# Patient Record
Sex: Male | Born: 1980 | Race: Black or African American | Hispanic: No | Marital: Married | Smoking: Former smoker
Health system: Southern US, Community
[De-identification: ages and names within clinical notes are randomized; demographics above are authoritative.]

## PROBLEM LIST (undated history)

## (undated) DIAGNOSIS — G4730 Sleep apnea, unspecified: Secondary | ICD-10-CM

## (undated) DIAGNOSIS — M48062 Spinal stenosis, lumbar region with neurogenic claudication: Secondary | ICD-10-CM

## (undated) DIAGNOSIS — R519 Headache, unspecified: Secondary | ICD-10-CM

## (undated) DIAGNOSIS — I10 Essential (primary) hypertension: Secondary | ICD-10-CM

## (undated) DIAGNOSIS — E039 Hypothyroidism, unspecified: Secondary | ICD-10-CM

## (undated) DIAGNOSIS — R51 Headache: Secondary | ICD-10-CM

## (undated) DIAGNOSIS — F419 Anxiety disorder, unspecified: Secondary | ICD-10-CM

## (undated) DIAGNOSIS — K219 Gastro-esophageal reflux disease without esophagitis: Secondary | ICD-10-CM

## (undated) HISTORY — PX: NO PAST SURGERIES: SHX2092

---

## 2017-06-26 ENCOUNTER — Other Ambulatory Visit: Payer: Self-pay | Admitting: Family Medicine

## 2017-06-26 DIAGNOSIS — M5441 Lumbago with sciatica, right side: Secondary | ICD-10-CM

## 2017-06-26 DIAGNOSIS — M5442 Lumbago with sciatica, left side: Principal | ICD-10-CM

## 2017-06-29 ENCOUNTER — Ambulatory Visit
Admission: RE | Admit: 2017-06-29 | Discharge: 2017-06-29 | Disposition: A | Discharge status: Home / Self Care | Payer: Managed Care, Other (non HMO) | Source: Ambulatory Visit | Attending: Family Medicine | Admitting: Family Medicine

## 2017-06-29 DIAGNOSIS — M5442 Lumbago with sciatica, left side: Principal | ICD-10-CM

## 2017-06-29 DIAGNOSIS — M5441 Lumbago with sciatica, right side: Secondary | ICD-10-CM

## 2017-07-15 ENCOUNTER — Other Ambulatory Visit: Payer: Self-pay | Admitting: Neurosurgery

## 2017-07-23 NOTE — Pre-Procedure Instructions (Signed)
Jesse Reilly  07/23/2017    Your procedure is scheduled on Monday, July 29, 2017 at 7:30 AM.   [facility] En[facility] "A" Admitting Office at 5:30 AM.   Call this number if you have problems the morning of surgery: [phone]   Questions prior to day of surgery, please call [phone] between 8 & 4 PM.   Remember:  Do not eat food or drink liquids after midnight Sunday, 07/28/17.  Take these medicines the morning of surgery with A SIP OF WATER: Levothyroxine (Synthroid), Clonazepam (Klonopin) - if needed, Hydrocodone - if needed, Omeprazole (Prilosec) - if needed  Stop NSAIDS (Diclofenac, Voltaren, Ibuprofen, etc) as of today. Do not use Aspirin products prior to surgery.   Do not wear jewelry.  Do not wear lotions, powders, cologne or deodorant.  Men may shave face and neck.  Do not bring valuables to the hospital.  [facility] is not responsible for any belongings or valuables.  Contacts, dentures or bridgework may not be worn into surgery.  Leave your suitcase in the car.  After surgery it may be brought to your room.  For patients admitted to the hospital, discharge time will be determined by your treatment team.  [facility] - Preparing [facility]  Before surgery, you can play an important role.  Because skin is not sterile, your skin needs to be as free of germs as possible.  You can reduce the number of germs on you skin by washing with CHG (chlorahexidine gluconate) soap before surgery.  CHG is an antiseptic cleaner which kills germs and bonds with the skin to continue killing germs even after washing.  Please DO NOT use if you have an allergy to CHG or antibacterial soaps.  If your skin becomes reddened/irritated stop using the CHG and inform your nurse when you arrive at Short Stay.  Do not shave (including legs and underarms) for at least 48 hours prior to the first CHG shower.  You may shave your face.  Please follow these instructions  carefully:   1.  Shower with CHG Soap the night before surgery and the                    morning of Surgery.  2.  If you choose to wash your hair, wash your hair first as usual with your       normal shampoo.  3.  After you shampoo, rinse your hair and body thoroughly to remove the shampoo.  4.  Use CHG as you would any other liquid soap.  You can apply chg directly       to the skin and wash gently with scrungie or a clean washcloth.  5.  Apply the CHG Soap to your body ONLY FROM THE NECK DOWN.        Do not use on open wounds or open sores.  Avoid contact with your eyes, ears, mouth and genitals (private parts).  Wash genitals (private parts) with your normal soap.  6.  Wash thoroughly, paying special attention to the area where your surgery        will be performed.  7.  Thoroughly rinse your body with warm water from the neck down.  8.  DO NOT shower/wash with your normal soap after using and rinsing off       the CHG Soap.  9.  Pat yourself dry with a clean towel.  10.  Wear clean pajamas.            11.  Place clean sheets on your bed the night of your first shower and do not        sleep with pets.  Day of Surgery  Shower as above. Do not apply any lotions/deodorants the morning of surgery.  Please wear clean clothes to the hospital.   Please read over the fact sheets that you were given.

## 2017-07-24 ENCOUNTER — Encounter (HOSPITAL_COMMUNITY)
Admission: RE | Admit: 2017-07-24 | Discharge: 2017-07-24 | Disposition: A | Discharge status: Home / Self Care | Payer: Managed Care, Other (non HMO) | Source: Ambulatory Visit | Attending: Neurosurgery | Admitting: Neurosurgery

## 2017-07-24 ENCOUNTER — Other Ambulatory Visit: Payer: Self-pay

## 2017-07-24 ENCOUNTER — Encounter (HOSPITAL_COMMUNITY): Payer: Self-pay

## 2017-07-24 ENCOUNTER — Other Ambulatory Visit (HOSPITAL_COMMUNITY): Payer: Self-pay | Admitting: *Deleted

## 2017-07-24 DIAGNOSIS — M48062 Spinal stenosis, lumbar region with neurogenic claudication: Secondary | ICD-10-CM | POA: Insufficient documentation

## 2017-07-24 DIAGNOSIS — Z01810 Encounter for preprocedural cardiovascular examination: Secondary | ICD-10-CM | POA: Insufficient documentation

## 2017-07-24 DIAGNOSIS — I10 Essential (primary) hypertension: Secondary | ICD-10-CM | POA: Diagnosis not present

## 2017-07-24 HISTORY — DX: Gastro-esophageal reflux disease without esophagitis: K21.9

## 2017-07-24 HISTORY — DX: Anxiety disorder, unspecified: F41.9

## 2017-07-24 HISTORY — DX: Essential (primary) hypertension: I10

## 2017-07-24 HISTORY — DX: Hypothyroidism, unspecified: E03.9

## 2017-07-24 HISTORY — DX: Headache: R51

## 2017-07-24 HISTORY — DX: Headache, unspecified: R51.9

## 2017-07-24 HISTORY — DX: Sleep apnea, unspecified: G47.30

## 2017-07-24 LAB — BASIC METABOLIC PANEL
ANION GAP: 13 (ref 5–15)
BUN: 20 mg/dL (ref 6–20)
CALCIUM: 9.6 mg/dL (ref 8.9–10.3)
CHLORIDE: 97 mmol/L — AB (ref 101–111)
CO2: 27 mmol/L (ref 22–32)
Creatinine, Ser: 1.09 mg/dL (ref 0.61–1.24)
GFR calc Af Amer: >60 mL/min (ref >60)
GFR calc non Af Amer: >60 mL/min (ref >60)
GLUCOSE: 109 mg/dL — AB (ref 65–99)
POTASSIUM: 3.6 mmol/L (ref 3.5–5.1)
Sodium: 137 mmol/L (ref 135–145)

## 2017-07-24 LAB — TYPE AND SCREEN
ABO/RH(D): B NEG
Antibody Screen: NEGATIVE

## 2017-07-24 LAB — CBC
HEMATOCRIT: 47.8 % (ref 39.0–52.0)
HEMOGLOBIN: 16.7 g/dL (ref 13.0–17.0)
MCH: 31.5 pg (ref 26.0–34.0)
MCHC: 34.9 g/dL (ref 30.0–36.0)
MCV: 90 fL (ref 78.0–100.0)
Platelets: 288 10*3/uL (ref 150–400)
RBC: 5.31 MIL/uL (ref 4.22–5.81)
RDW: 13.4 % (ref 11.5–15.5)
WBC: 9.2 10*3/uL (ref 4.0–10.5)

## 2017-07-24 LAB — ABO/RH: ABO/RH(D): B NEG

## 2017-07-24 LAB — SURGICAL PCR SCREEN
MRSA, PCR: NEGATIVE
Staphylococcus aureus: NEGATIVE

## 2017-07-24 NOTE — Progress Notes (Addendum)
Pt denies cardiac history, chest pain or sob. Pt is treated for HTN. Pt[place]   Pt will be bringing his Cpap with him day of surgery.

## 2017-07-26 MED ORDER — DEXTROSE 5 % IV SOLN
3.0000 g | INTRAVENOUS | Status: AC
  Administered 2017-07-29: 3 g via INTRAVENOUS
  Administered 2017-07-29: 2 g via INTRAVENOUS
  Filled 2017-07-26: qty 3000

## 2017-07-29 ENCOUNTER — Inpatient Hospital Stay (HOSPITAL_COMMUNITY): Payer: Managed Care, Other (non HMO) | Admitting: Anesthesiology

## 2017-07-29 ENCOUNTER — Inpatient Hospital Stay (HOSPITAL_COMMUNITY): Payer: Managed Care, Other (non HMO)

## 2017-07-29 ENCOUNTER — Inpatient Hospital Stay (HOSPITAL_COMMUNITY)
Admission: RE | Admit: 2017-07-29 | Discharge: 2017-08-01 | DRG: 455 | Disposition: A | Discharge status: Home / Self Care | Payer: Managed Care, Other (non HMO) | Source: Ambulatory Visit | Attending: Neurosurgery | Admitting: Neurosurgery

## 2017-07-29 ENCOUNTER — Other Ambulatory Visit: Payer: Self-pay

## 2017-07-29 ENCOUNTER — Inpatient Hospital Stay (HOSPITAL_COMMUNITY)
Admission: RE | Disposition: A | Discharge status: Home / Self Care | Payer: Self-pay | Source: Ambulatory Visit | Attending: Neurosurgery

## 2017-07-29 ENCOUNTER — Encounter (HOSPITAL_COMMUNITY): Payer: Self-pay

## 2017-07-29 DIAGNOSIS — Z7982 Long term (current) use of aspirin: Secondary | ICD-10-CM | POA: Diagnosis not present

## 2017-07-29 DIAGNOSIS — M47816 Spondylosis without myelopathy or radiculopathy, lumbar region: Secondary | ICD-10-CM | POA: Diagnosis present

## 2017-07-29 DIAGNOSIS — M48062 Spinal stenosis, lumbar region with neurogenic claudication: Principal | ICD-10-CM | POA: Diagnosis present

## 2017-07-29 DIAGNOSIS — Z419 Encounter for procedure for purposes other than remedying health state, unspecified: Secondary | ICD-10-CM

## 2017-07-29 DIAGNOSIS — T40605A Adverse effect of unspecified narcotics, initial encounter: Secondary | ICD-10-CM | POA: Diagnosis not present

## 2017-07-29 DIAGNOSIS — M545 Low back pain: Secondary | ICD-10-CM | POA: Diagnosis present

## 2017-07-29 DIAGNOSIS — Z79890 Hormone replacement therapy (postmenopausal): Secondary | ICD-10-CM

## 2017-07-29 DIAGNOSIS — M5136 Other intervertebral disc degeneration, lumbar region: Secondary | ICD-10-CM | POA: Diagnosis present

## 2017-07-29 DIAGNOSIS — G4730 Sleep apnea, unspecified: Secondary | ICD-10-CM | POA: Diagnosis present

## 2017-07-29 DIAGNOSIS — E039 Hypothyroidism, unspecified: Secondary | ICD-10-CM | POA: Diagnosis present

## 2017-07-29 DIAGNOSIS — K219 Gastro-esophageal reflux disease without esophagitis: Secondary | ICD-10-CM | POA: Diagnosis present

## 2017-07-29 DIAGNOSIS — I10 Essential (primary) hypertension: Secondary | ICD-10-CM | POA: Diagnosis present

## 2017-07-29 DIAGNOSIS — K5903 Drug induced constipation: Secondary | ICD-10-CM | POA: Diagnosis not present

## 2017-07-29 DIAGNOSIS — Z87891 Personal history of nicotine dependence: Secondary | ICD-10-CM

## 2017-07-29 DIAGNOSIS — Z6836 Body mass index (BMI) 36.0-36.9, adult: Secondary | ICD-10-CM | POA: Diagnosis not present

## 2017-07-29 HISTORY — DX: Spinal stenosis, lumbar region with neurogenic claudication: M48.062

## 2017-07-29 SURGERY — POSTERIOR LUMBAR FUSION 2 LEVEL
Anesthesia: General | Site: Back

## 2017-07-29 MED ORDER — HYDROXYZINE HCL 50 MG/ML IM SOLN: 50.0000 mg | INTRAMUSCULAR | Status: DC | PRN

## 2017-07-29 MED ORDER — LIDOCAINE HCL (CARDIAC) 20 MG/ML IV SOLN
INTRAVENOUS | Status: AC
  Filled 2017-07-29: qty 5

## 2017-07-29 MED ORDER — OXYCODONE HCL 5 MG PO TABS
5.0000 mg | ORAL_TABLET | Freq: Once | ORAL | Status: AC | PRN
  Administered 2017-07-29: 5 mg via ORAL

## 2017-07-29 MED ORDER — MORPHINE SULFATE (PF) 4 MG/ML IV SOLN: 4.0000 mg | INTRAVENOUS | Status: DC | PRN

## 2017-07-29 MED ORDER — KETOROLAC TROMETHAMINE 30 MG/ML IJ SOLN
30.0000 mg | Freq: Once | INTRAMUSCULAR | Status: AC
  Administered 2017-07-29: 30 mg via INTRAVENOUS

## 2017-07-29 MED ORDER — MENTHOL 3 MG MT LOZG: 1.0000 | LOZENGE | OROMUCOSAL | Status: DC | PRN

## 2017-07-29 MED ORDER — MAGNESIUM HYDROXIDE 400 MG/5ML PO SUSP
30.0000 mL | Freq: Every day | ORAL | Status: DC | PRN
  Administered 2017-07-30: 30 mL via ORAL
  Filled 2017-07-29: qty 30

## 2017-07-29 MED ORDER — CHLORHEXIDINE GLUCONATE CLOTH 2 % EX PADS: 6.0000 | MEDICATED_PAD | Freq: Once | CUTANEOUS | Status: DC

## 2017-07-29 MED ORDER — THROMBIN (RECOMBINANT) 5000 UNITS EX SOLR
CUTANEOUS | Status: DC | PRN
  Administered 2017-07-29: 5 mL

## 2017-07-29 MED ORDER — SODIUM CHLORIDE 0.9 % IJ SOLN
INTRAMUSCULAR | Status: AC
  Filled 2017-07-29: qty 10

## 2017-07-29 MED ORDER — SODIUM CHLORIDE 0.9 % IR SOLN
Status: DC | PRN
  Administered 2017-07-29 (×2): 500 mL

## 2017-07-29 MED ORDER — CYCLOBENZAPRINE HCL 10 MG PO TABS
ORAL_TABLET | ORAL | Status: AC
  Filled 2017-07-29: qty 1

## 2017-07-29 MED ORDER — SUGAMMADEX SODIUM 500 MG/5ML IV SOLN
INTRAVENOUS | Status: DC | PRN
  Administered 2017-07-29: 300 mg via INTRAVENOUS

## 2017-07-29 MED ORDER — FENTANYL CITRATE (PF) 250 MCG/5ML IJ SOLN
INTRAMUSCULAR | Status: AC
  Filled 2017-07-29: qty 5

## 2017-07-29 MED ORDER — HYDROCODONE-ACETAMINOPHEN 5-325 MG PO TABS
1.0000 | ORAL_TABLET | ORAL | Status: DC | PRN
  Administered 2017-07-29 – 2017-08-01 (×16): 2 via ORAL
  Filled 2017-07-29 (×16): qty 2

## 2017-07-29 MED ORDER — THROMBIN 5000 UNITS EX SOLR
CUTANEOUS | Status: AC
  Filled 2017-07-29: qty 5000

## 2017-07-29 MED ORDER — ROCURONIUM BROMIDE 100 MG/10ML IV SOLN
INTRAVENOUS | Status: DC | PRN
  Administered 2017-07-29: 30 mg via INTRAVENOUS
  Administered 2017-07-29 (×2): 20 mg via INTRAVENOUS
  Administered 2017-07-29: 70 mg via INTRAVENOUS
  Administered 2017-07-29: 10 mg via INTRAVENOUS
  Administered 2017-07-29: 50 mg via INTRAVENOUS
  Administered 2017-07-29: 20 mg via INTRAVENOUS

## 2017-07-29 MED ORDER — KETOROLAC TROMETHAMINE 30 MG/ML IJ SOLN
30.0000 mg | Freq: Four times a day (QID) | INTRAMUSCULAR | Status: AC
  Administered 2017-07-29 – 2017-07-31 (×6): 30 mg via INTRAVENOUS
  Filled 2017-07-29 (×5): qty 1

## 2017-07-29 MED ORDER — ACETAMINOPHEN 160 MG/5ML PO SOLN: 325.0000 mg | ORAL | Status: DC | PRN

## 2017-07-29 MED ORDER — ESMOLOL HCL 100 MG/10ML IV SOLN
INTRAVENOUS | Status: DC | PRN
  Administered 2017-07-29: 10 mg via INTRAVENOUS
  Administered 2017-07-29 (×2): 20 mg via INTRAVENOUS
  Administered 2017-07-29: 30 mg via INTRAVENOUS
  Administered 2017-07-29: 20 mg via INTRAVENOUS

## 2017-07-29 MED ORDER — FENTANYL CITRATE (PF) 100 MCG/2ML IJ SOLN
INTRAMUSCULAR | Status: AC
  Filled 2017-07-29: qty 2

## 2017-07-29 MED ORDER — CLONAZEPAM 1 MG PO TABS: 1.0000 mg | ORAL_TABLET | Freq: Four times a day (QID) | ORAL | Status: DC | PRN

## 2017-07-29 MED ORDER — MIDAZOLAM HCL 2 MG/2ML IJ SOLN
INTRAMUSCULAR | Status: AC
  Filled 2017-07-29: qty 2

## 2017-07-29 MED ORDER — ACETAMINOPHEN 325 MG PO TABS: 325.0000 mg | ORAL_TABLET | ORAL | Status: DC | PRN

## 2017-07-29 MED ORDER — LIDOCAINE-EPINEPHRINE 1 %-1:100000 IJ SOLN
INTRAMUSCULAR | Status: AC
  Filled 2017-07-29: qty 1

## 2017-07-29 MED ORDER — SODIUM CHLORIDE 0.9% FLUSH: 3.0000 mL | INTRAVENOUS | Status: DC | PRN

## 2017-07-29 MED ORDER — 0.9 % SODIUM CHLORIDE (POUR BTL) OPTIME
TOPICAL | Status: DC | PRN
Start: 2017-07-29 — End: 2017-07-29
  Administered 2017-07-29 (×3): 1000 mL

## 2017-07-29 MED ORDER — SURGIFOAM 100 EX MISC
CUTANEOUS | Status: DC | PRN
  Administered 2017-07-29: 20 mL via TOPICAL

## 2017-07-29 MED ORDER — LIDOCAINE 2% (20 MG/ML) 5 ML SYRINGE
INTRAMUSCULAR | Status: DC | PRN
  Administered 2017-07-29: 100 mg via INTRAVENOUS

## 2017-07-29 MED ORDER — CHLORTHALIDONE 25 MG PO TABS
25.0000 mg | ORAL_TABLET | Freq: Every day | ORAL | Status: DC
  Filled 2017-07-29: qty 1

## 2017-07-29 MED ORDER — ESMOLOL HCL 100 MG/10ML IV SOLN
INTRAVENOUS | Status: AC
  Filled 2017-07-29: qty 20

## 2017-07-29 MED ORDER — KETOROLAC TROMETHAMINE 30 MG/ML IJ SOLN
INTRAMUSCULAR | Status: AC
  Filled 2017-07-29: qty 1

## 2017-07-29 MED ORDER — LEVOTHYROXINE SODIUM 200 MCG PO TABS
200.0000 ug | ORAL_TABLET | Freq: Every day | ORAL | Status: DC
  Administered 2017-07-30 – 2017-08-01 (×3): 200 ug via ORAL
  Filled 2017-07-29 (×3): qty 1
  Filled 2017-07-29: qty 2
  Filled 2017-07-29: qty 1

## 2017-07-29 MED ORDER — PHENOL 1.4 % MT LIQD: 1.0000 | OROMUCOSAL | Status: DC | PRN

## 2017-07-29 MED ORDER — CEFAZOLIN SODIUM 1 G IJ SOLR
INTRAMUSCULAR | Status: AC
  Filled 2017-07-29: qty 20

## 2017-07-29 MED ORDER — BISACODYL 10 MG RE SUPP
10.0000 mg | Freq: Every day | RECTAL | Status: DC | PRN
  Administered 2017-07-30 – 2017-07-31 (×2): 10 mg via RECTAL
  Filled 2017-07-29 (×2): qty 1

## 2017-07-29 MED ORDER — OXYCODONE HCL 5 MG PO TABS
ORAL_TABLET | ORAL | Status: AC
  Filled 2017-07-29: qty 1

## 2017-07-29 MED ORDER — ONDANSETRON HCL 4 MG/2ML IJ SOLN
INTRAMUSCULAR | Status: DC | PRN
  Administered 2017-07-29: 4 mg via INTRAVENOUS

## 2017-07-29 MED ORDER — BUPIVACAINE HCL (PF) 0.5 % IJ SOLN
INTRAMUSCULAR | Status: DC | PRN
  Administered 2017-07-29: 15 mL

## 2017-07-29 MED ORDER — POTASSIUM CHLORIDE CRYS ER 20 MEQ PO TBCR
20.0000 meq | EXTENDED_RELEASE_TABLET | Freq: Every day | ORAL | Status: DC
  Administered 2017-07-30 – 2017-08-01 (×3): 20 meq via ORAL
  Filled 2017-07-29 (×3): qty 1

## 2017-07-29 MED ORDER — ACETAMINOPHEN 325 MG PO TABS: 650.0000 mg | ORAL_TABLET | ORAL | Status: DC | PRN

## 2017-07-29 MED ORDER — PROPOFOL 10 MG/ML IV BOLUS
INTRAVENOUS | Status: DC | PRN
  Administered 2017-07-29: 50 mg via INTRAVENOUS
  Administered 2017-07-29: 240 mg via INTRAVENOUS
  Administered 2017-07-29: 30 mg via INTRAVENOUS

## 2017-07-29 MED ORDER — SODIUM CHLORIDE 0.9% FLUSH
3.0000 mL | Freq: Two times a day (BID) | INTRAVENOUS | Status: DC
  Administered 2017-07-29: 3 mL via INTRAVENOUS

## 2017-07-29 MED ORDER — PROPOFOL 10 MG/ML IV BOLUS
INTRAVENOUS | Status: AC
  Filled 2017-07-29: qty 40

## 2017-07-29 MED ORDER — FENTANYL CITRATE (PF) 100 MCG/2ML IJ SOLN
INTRAMUSCULAR | Status: DC | PRN
  Administered 2017-07-29 (×2): 50 ug via INTRAVENOUS
  Administered 2017-07-29: 100 ug via INTRAVENOUS
  Administered 2017-07-29 (×3): 50 ug via INTRAVENOUS
  Administered 2017-07-29 (×3): 100 ug via INTRAVENOUS

## 2017-07-29 MED ORDER — DEXAMETHASONE SODIUM PHOSPHATE 10 MG/ML IJ SOLN
INTRAMUSCULAR | Status: AC
  Filled 2017-07-29: qty 1

## 2017-07-29 MED ORDER — FENTANYL CITRATE (PF) 100 MCG/2ML IJ SOLN: 25.0000 ug | INTRAMUSCULAR | Status: DC | PRN

## 2017-07-29 MED ORDER — CYCLOBENZAPRINE HCL 5 MG PO TABS
5.0000 mg | ORAL_TABLET | Freq: Three times a day (TID) | ORAL | Status: DC | PRN
  Administered 2017-07-29 – 2017-07-30 (×3): 10 mg via ORAL
  Filled 2017-07-29 (×2): qty 2

## 2017-07-29 MED ORDER — CHLORTHALIDONE 25 MG PO TABS
25.0000 mg | ORAL_TABLET | Freq: Every day | ORAL | Status: DC
  Administered 2017-07-29 – 2017-08-01 (×4): 25 mg via ORAL
  Filled 2017-07-29 (×4): qty 1

## 2017-07-29 MED ORDER — HYDROXYZINE HCL 25 MG PO TABS: 50.0000 mg | ORAL_TABLET | ORAL | Status: DC | PRN

## 2017-07-29 MED ORDER — ACETAMINOPHEN 650 MG RE SUPP: 650.0000 mg | RECTAL | Status: DC | PRN

## 2017-07-29 MED ORDER — METHYLPREDNISOLONE ACETATE 80 MG/ML IJ SUSP
INTRAMUSCULAR | Status: AC
  Filled 2017-07-29: qty 1

## 2017-07-29 MED ORDER — FENTANYL CITRATE (PF) 250 MCG/5ML IJ SOLN
INTRAMUSCULAR | Status: AC
Start: 2017-07-29 — End: ?
  Filled 2017-07-29: qty 5

## 2017-07-29 MED ORDER — ALUM & MAG HYDROXIDE-SIMETH 200-200-20 MG/5ML PO SUSP: 30.0000 mL | Freq: Four times a day (QID) | ORAL | Status: DC | PRN

## 2017-07-29 MED ORDER — ONDANSETRON HCL 4 MG/2ML IJ SOLN
INTRAMUSCULAR | Status: AC
  Filled 2017-07-29: qty 2

## 2017-07-29 MED ORDER — MIDAZOLAM HCL 5 MG/5ML IJ SOLN
INTRAMUSCULAR | Status: DC | PRN
  Administered 2017-07-29: 2 mg via INTRAVENOUS

## 2017-07-29 MED ORDER — SUGAMMADEX SODIUM 500 MG/5ML IV SOLN
INTRAVENOUS | Status: AC
  Filled 2017-07-29: qty 5

## 2017-07-29 MED ORDER — FLEET ENEMA 7-19 GM/118ML RE ENEM
1.0000 | ENEMA | Freq: Once | RECTAL | Status: AC | PRN
  Administered 2017-07-30: 1 via RECTAL
  Filled 2017-07-29: qty 1

## 2017-07-29 MED ORDER — BUPIVACAINE HCL (PF) 0.5 % IJ SOLN
INTRAMUSCULAR | Status: AC
  Filled 2017-07-29: qty 30

## 2017-07-29 MED ORDER — DEXAMETHASONE SODIUM PHOSPHATE 4 MG/ML IJ SOLN
INTRAMUSCULAR | Status: DC | PRN
  Administered 2017-07-29: 10 mg via INTRAVENOUS

## 2017-07-29 MED ORDER — ROCURONIUM BROMIDE 10 MG/ML (PF) SYRINGE
PREFILLED_SYRINGE | INTRAVENOUS | Status: AC
  Filled 2017-07-29: qty 10

## 2017-07-29 MED ORDER — ROCURONIUM BROMIDE 10 MG/ML (PF) SYRINGE
PREFILLED_SYRINGE | INTRAVENOUS | Status: AC
  Filled 2017-07-29: qty 5

## 2017-07-29 MED ORDER — LACTATED RINGERS IV SOLN
INTRAVENOUS | Status: DC | PRN
  Administered 2017-07-29 (×3): via INTRAVENOUS

## 2017-07-29 MED ORDER — LIDOCAINE-EPINEPHRINE 1 %-1:100000 IJ SOLN
INTRAMUSCULAR | Status: DC | PRN
  Administered 2017-07-29: 15 mL

## 2017-07-29 MED ORDER — THROMBIN 20000 UNITS EX SOLR
CUTANEOUS | Status: AC
  Filled 2017-07-29: qty 20000

## 2017-07-29 MED ORDER — OXYCODONE HCL 5 MG/5ML PO SOLN: 5.0000 mg | Freq: Once | ORAL | Status: AC | PRN

## 2017-07-29 MED ORDER — KCL IN DEXTROSE-NACL 40-5-0.45 MEQ/L-%-% IV SOLN
INTRAVENOUS | Status: DC
  Filled 2017-07-29: qty 1000

## 2017-07-29 SURGICAL SUPPLY — 81 items
BAG DECANTER FOR FLEXI CONT (MISCELLANEOUS) ×2 IMPLANT
BENZOIN TINCTURE PRP APPL 2/3 (GAUZE/BANDAGES/DRESSINGS) IMPLANT
BLADE CLIPPER SURG (BLADE) ×2 IMPLANT
BUR ACRON 5.0MM COATED (BURR) ×2 IMPLANT
BUR MATCHSTICK NEURO 3.0 LAGG (BURR) ×2 IMPLANT
CANISTER SUCT 3000ML PPV (MISCELLANEOUS) ×2 IMPLANT
CAP LCK SPNE (Orthopedic Implant) ×6 IMPLANT
CAP LOCK SPINE RADIUS (Orthopedic Implant) ×6 IMPLANT
CAP LOCKING (Orthopedic Implant) ×6 IMPLANT
CARTRIDGE OIL MAESTRO DRILL (MISCELLANEOUS) ×1 IMPLANT
CONT SPEC 4OZ CLIKSEAL STRL BL (MISCELLANEOUS) ×2 IMPLANT
COVER BACK TABLE 60X90IN (DRAPES) ×2 IMPLANT
DECANTER SPIKE VIAL GLASS SM (MISCELLANEOUS) ×2 IMPLANT
DERMABOND ADVANCED (GAUZE/BANDAGES/DRESSINGS) ×1
DERMABOND ADVANCED .7 DNX12 (GAUZE/BANDAGES/DRESSINGS) ×1 IMPLANT
DIFFUSER DRILL AIR PNEUMATIC (MISCELLANEOUS) ×2 IMPLANT
DRAPE C-ARM 42X72 X-RAY (DRAPES) ×4 IMPLANT
DRAPE C-ARMOR (DRAPES) IMPLANT
DRAPE HALF SHEET 40X57 (DRAPES) ×2 IMPLANT
DRAPE LAPAROTOMY 100X72X124 (DRAPES) ×2 IMPLANT
DRAPE POUCH INSTRU U-SHP 10X18 (DRAPES) IMPLANT
ELECT BLADE 4.0 EZ CLEAN MEGAD (MISCELLANEOUS) ×2
ELECT REM PT RETURN 9FT ADLT (ELECTROSURGICAL) ×2
ELECTRODE BLDE 4.0 EZ CLN MEGD (MISCELLANEOUS) ×1 IMPLANT
ELECTRODE REM PT RTRN 9FT ADLT (ELECTROSURGICAL) ×1 IMPLANT
GAUZE SPONGE 4X4 12PLY STRL (GAUZE/BANDAGES/DRESSINGS) IMPLANT
GAUZE SPONGE 4X4 12PLY STRL LF (GAUZE/BANDAGES/DRESSINGS) ×2 IMPLANT
GAUZE SPONGE 4X4 16PLY XRAY LF (GAUZE/BANDAGES/DRESSINGS) ×2 IMPLANT
GLOVE BIOGEL PI IND STRL 6.5 (GLOVE) ×7 IMPLANT
GLOVE BIOGEL PI IND STRL 8 (GLOVE) ×2 IMPLANT
GLOVE BIOGEL PI INDICATOR 6.5 (GLOVE) ×7
GLOVE BIOGEL PI INDICATOR 8 (GLOVE) ×2
GLOVE ECLIPSE 7.5 STRL STRAW (GLOVE) ×4 IMPLANT
GLOVE SURG SS PI 6.0 STRL IVOR (GLOVE) ×10 IMPLANT
GLOVE SURG SS PI 6.5 STRL IVOR (GLOVE) ×14 IMPLANT
GOWN STRL REUS W/ TWL LRG LVL3 (GOWN DISPOSABLE) ×2 IMPLANT
GOWN STRL REUS W/ TWL XL LVL3 (GOWN DISPOSABLE) ×3 IMPLANT
GOWN STRL REUS W/TWL 2XL LVL3 (GOWN DISPOSABLE) IMPLANT
GOWN STRL REUS W/TWL LRG LVL3 (GOWN DISPOSABLE) ×2
GOWN STRL REUS W/TWL XL LVL3 (GOWN DISPOSABLE) ×3
HEMOSTAT POWDER KIT SURGIFOAM (HEMOSTASIS) ×2 IMPLANT
KIT BASIN OR (CUSTOM PROCEDURE TRAY) ×2 IMPLANT
KIT INFUSE SMALL (Orthopedic Implant) ×2 IMPLANT
KIT ROOM TURNOVER OR (KITS) ×2 IMPLANT
MILL MEDIUM DISP (BLADE) ×2 IMPLANT
NEEDLE 18GX1X1/2 (RX/OR ONLY) (NEEDLE) IMPLANT
NEEDLE ASP BONE MRW 8GX15 (NEEDLE) ×2 IMPLANT
NEEDLE SPNL 18GX3.5 QUINCKE PK (NEEDLE) ×4 IMPLANT
NEEDLE SPNL 22GX3.5 QUINCKE BK (NEEDLE) ×2 IMPLANT
NS IRRIG 1000ML POUR BTL (IV SOLUTION) ×4 IMPLANT
OIL CARTRIDGE MAESTRO DRILL (MISCELLANEOUS) ×2
PACK LAMINECTOMY NEURO (CUSTOM PROCEDURE TRAY) ×2 IMPLANT
PAD ARMBOARD 7.5X6 YLW CONV (MISCELLANEOUS) ×8 IMPLANT
PATTIES SURGICAL .5 X.5 (GAUZE/BANDAGES/DRESSINGS) IMPLANT
PATTIES SURGICAL .5 X1 (DISPOSABLE) ×2 IMPLANT
PATTIES SURGICAL 1X1 (DISPOSABLE) IMPLANT
ROD 80MM (Rod) ×2 IMPLANT
ROD SPNL 80X5.5 NS TI RDS (Rod) ×2 IMPLANT
SCREW 5.75X45MM (Screw) ×4 IMPLANT
SCREW 6.75X45MM (Screw) ×8 IMPLANT
SPACER SPINAL 11X25X12 4D (Spacer) ×4 IMPLANT
SPACER SPINAL 8X25X12 4D (Spacer) ×4 IMPLANT
SPONGE LAP 4X18 X RAY DECT (DISPOSABLE) ×2 IMPLANT
SPONGE NEURO XRAY DETECT 1X3 (DISPOSABLE) ×4 IMPLANT
SPONGE SURGIFOAM ABS GEL 100 (HEMOSTASIS) ×2 IMPLANT
STAPLER SKIN PROX WIDE 3.9 (STAPLE) IMPLANT
STRIP BIOACTIVE VITOSS 25X100X (Neuro Prosthesis/Implant) ×2 IMPLANT
STRIP CLOSURE SKIN 1/2X4 (GAUZE/BANDAGES/DRESSINGS) IMPLANT
SUT PROLENE 6 0 BV (SUTURE) IMPLANT
SUT VIC AB 1 CT1 18XBRD ANBCTR (SUTURE) ×3 IMPLANT
SUT VIC AB 1 CT1 8-18 (SUTURE) ×3
SUT VIC AB 2-0 CP2 18 (SUTURE) ×6 IMPLANT
SYR 3ML LL SCALE MARK (SYRINGE) ×18 IMPLANT
SYR 5ML LL (SYRINGE) IMPLANT
SYR CONTROL 10ML LL (SYRINGE) ×4 IMPLANT
TAPE CLOTH SURG 4X10 WHT LF (GAUZE/BANDAGES/DRESSINGS) ×2 IMPLANT
TOWEL GREEN STERILE (TOWEL DISPOSABLE) ×2 IMPLANT
TOWEL GREEN STERILE FF (TOWEL DISPOSABLE) ×2 IMPLANT
TRAP SPECIMEN MUCOUS 40CC (MISCELLANEOUS) IMPLANT
TRAY FOLEY W/METER SILVER 16FR (SET/KITS/TRAYS/PACK) ×2 IMPLANT
WATER STERILE IRR 1000ML POUR (IV SOLUTION) ×2 IMPLANT

## 2017-07-29 NOTE — H&P (Signed)
Subjective: Patient is a 37 y.o. left-handed black male who is admitted for treatment of multilevel, multifactorial lumbar stenosis contributed to by large broad-based disc herniations, marked at the L3-4 level and severe at the L4-5 level with associated neurogenic claudication.  Patient's been having difficulties with his low back for at least 5 years, with typically more limited pain. However he developed increased difficulties 6 months ago. With pain in the low back extending into the buttocks, posterior thighs, and calves bilaterally, consistent with nerogenic claudication. He's been treated with chiropractic treatment, physical therapy, NSAIDs, and other medications without relief. Pain is worse standing and walking but he can have significant disabling pain even when sitting. He's had some difficulties with both voiding and with bowel movements. Patient admitted now for an L3-L5 decompressive lumbar laminectomy, bilateral L3-4 and L4-5 posterior lumbar interbody arthrodesis with interbody implants and bone graft, bilateral L3-L5 posterior lateral arthrodesis with posterior instrumentation and bone graft.    Past Medical History:  Diagnosis Date  . Anxiety   . GERD (gastroesophageal reflux disease)   . Headache   . Hypertension   . Hypothyroidism   . Lumbar stenosis with neurogenic claudication   . Sleep apnea    Mild - uses cpap    Past Surgical History:  Procedure Laterality Date  . NO PAST SURGERIES      Medications Prior to Admission  Medication Sig Dispense Refill Last Dose  . chlorthalidone (HYGROTON) 25 MG tablet Take 25 mg by mouth daily.   07/28/2017 at Unknown time  . clonazePAM (KLONOPIN) 1 MG tablet Take 1 mg by mouth 4 (four) times daily as needed for anxiety.   07/29/2017 at 0200  . cyclobenzaprine (FLEXERIL) 5 MG tablet Take 5 mg by mouth 3 (three) times daily as needed for muscle spasms.   07/29/2017 at 0200  . diclofenac (VOLTAREN) 75 MG EC tablet Take 75 mg by mouth 2  (two) times daily.   Past Week at Unknown time  . HYDROcodone-acetaminophen (NORCO) 7.5-325 MG tablet Take 1 tablet by mouth every 6 (six) hours as needed (for pain.).   07/29/2017 at 0330  . levothyroxine (SYNTHROID, LEVOTHROID) 200 MCG tablet Take 200 mcg by mouth daily before breakfast.   07/29/2017 at 0200  . Naphazoline HCl (CLEAR EYES OP) Place 1-2 drops into both eyes 3 (three) times daily as needed (for dry/irritated eye).   07/29/2017 at 0200  . omeprazole (PRILOSEC) 20 MG capsule Take 20 mg by mouth daily as needed (for acid reflux.).   07/28/2017 at Unknown time  . potassium chloride SA (K-DUR,KLOR-CON) 20 MEQ tablet Take 20 mEq by mouth daily.   07/28/2017 at Unknown time  . aspirin EC 81 MG tablet Take 81 mg by mouth daily.   07/22/2017   Allergies  Allergen Reactions  . Lisinopril Swelling    Swelling in hands.    Social History   Tobacco Use  . Smoking status: Former Smoker    Last attempt to quit: 07/25/2014    Years since quitting: 3.0  . Smokeless tobacco: Never Used  Substance Use Topics  . Alcohol use: Yes    Comment: occasional    Family History  Problem Relation Age of Onset  . Heart attack Mother   . Hypertension Mother   . Diabetes Mother   . Other Mother        enlarged heart  . Heart attack Father      Review of Systems A comprehensive review of systems was negative.  Objective: Vital signs in last 24 hours: Temp:  [98.6 F (37 C)] 98.6 F (37 C) (03/11 0550) Pulse Rate:  [92] 92 (03/11 0550) Resp:  [18] 18 (03/11 0550) BP: (135)/(89) 135/89 (03/11 0550) SpO2:  [100 %] 100 % (03/11 0550)  EXAM: patient well-developed well-nourished male in no acute distress. Lungs are clear to auscultation , the patient has symmetrical respiratory excursion. Heart has a regular rate and rhythm normal S1 and S2 no murmur.   Abdomen is soft nontender nondistended bowel sounds are present. Extremity examination shows no clubbing cyanosis or edema. Motor examination shows  5 over 5 strength in the lower extremities including the iliopsoas quadriceps dorsiflexor extensor hallicus  longus and plantar flexor bilaterally. Sensation is intact to pinprick in the distal lower extremities. Reflexes are symmetrical bilaterally. No pathologic reflexes are present. Patient has a normal gait and stance.   Data Review:CBC    Component Value Date/Time   WBC 9.2 07/24/2017 0939   RBC 5.31 07/24/2017 0939   HGB 16.7 07/24/2017 0939   HCT 47.8 07/24/2017 0939   PLT 288 07/24/2017 0939   MCV 90.0 07/24/2017 0939   MCH 31.5 07/24/2017 0939   MCHC 34.9 07/24/2017 0939   RDW 13.4 07/24/2017 0939                          BMET    Component Value Date/Time   NA 137 07/24/2017 0939   K 3.6 07/24/2017 0939   CL 97 (L) 07/24/2017 0939   CO2 27 07/24/2017 0939   GLUCOSE 109 (H) 07/24/2017 0939   BUN 20 07/24/2017 0939   CREATININE 1.09 07/24/2017 0939   CALCIUM 9.6 07/24/2017 0939   GFRNONAA >60 07/24/2017 0939   GFRAA >60 07/24/2017 0939     Assessment/Plan: Patient with disabling neurogenic claudication, secondary to marked to severe multifactorial, multilevel lumbar stenosis contributed to by large broad-based disc herniations. He is admitted now for decompression and stabilization.  I've discussed with the patient the nature of his condition, the nature the surgical procedure, the typical length of surgery, hospital stay, and overall recuperation, the limitations postoperatively, and risks of surgery. I discussed risks including risks of infection, bleeding, possibly need for transfusion, the risk of nerve root dysfunction with pain, weakness, numbness, or paresthesias, the risk of dural tear and CSF leakage and possible need for further surgery, the risk of failure of the arthrodesis and possibly for further surgery, the risk of anesthetic complications including myocardial infarction, stroke, pneumonia, and death. We discussed the need for postoperative immobilization in a  lumbar brace. Understanding all this the patient does wish to proceed with surgery and is admitted for such.     Hewitt Shorts, MD 07/29/2017 7:30 AM

## 2017-07-29 NOTE — Progress Notes (Signed)
Vitals:   07/29/17 1501 07/29/17 1515 07/29/17 1525 07/29/17 1550  BP:  (!) 135/93 125/80 (!) 143/97  Pulse: (!) 118 (!) 118 (!) 119 (!) 117  Resp: 16 20 20 20   Temp:   98 F (36.7 C) 97.8 F (36.6 C)  TempSrc:      SpO2: 96% 96% 98% 94%    Patient sitting up in chair, comfortable. Excellent relief of disabling neurogenic claudication.  Dressing clean and dry. Has ambulate down the hall with the staff. Foley DC'd, patient has voided.  Plan: Doing well following surgery. Encouraged to ambulate. Continue to progress through postoperative recovery.  Hewitt ShortsNUDELMAN,ROBERT W, MD 07/29/2017, 6:02 PM

## 2017-07-29 NOTE — Op Note (Signed)
07/29/2017  1:52 PM  PATIENT:  Jesse RouteEric Placke  37 y.o. male  PRE-OPERATIVE DIAGNOSIS:  Multilevel, multifactorial lumbar stenosis with neurogenic claudication; L3-4 and L4-5 HNP; lumbar spondylosis; lumbar degenerative disc disease  POST-OPERATIVE DIAGNOSIS:  Multilevel, multifactorial lumbar stenosis with neurogenic claudication; L3-4 and L4-5 HNP; lumbar spondylosis; lumbar degenerative disc disease  PROCEDURE:  Procedure(s):  L3-L5 decompression including L3-L5 decompressive lumbar laminectomy, bilateral L3-4 and L4-5 facetectomies, foraminotomies for decompression of the stenotic compression of the exiting L3, L4, and L5 nerve roots bilaterally, bilateral L3-4 and L4-5 discectomies; L3-L5 stabilization including L3-4 and L4-5 posterior lumbar interbody arthrodesis with AVS peek interbody implants, locally harvested morcellized autograft, Vitoss BA with bone marrow aspirate, and infuse and bilateral L3-L5 posterior lateral arthrodesis with segmental radius posterior instrumentation, locally harvested morcellized autograft, Vitoss BA with bone marrow aspirate, and infuse  SURGEON:  Shirlean Kellyobert Nudelman, M.D.   ASSISTANTS:  Julio SicksHenry Pool, M.D.  ANESTHESIA:   general  EBL:  Total I/O In: 2250 [I.V.:2100; Blood:150] Out: 1110 [Urine:610; Blood:500]  BLOOD ADMINISTERED:150 CC CELLSAVER  COUNT: Correct per nursing staff  DICTATION: Patient was brought to the operating room placed under general endotracheal anesthesia. The patient was turned to prone position, the lumbar region was prepped with Betadine soap and solution and draped in a sterile fashion. The midline was infiltrated with local anesthesia with epinephrine. A localizing x-ray was taken and then a midline incision was made and carried down through the subcutaneous tissue, bipolar cautery and electrocautery were used to maintain hemostasis. Dissection was carried down to the lumbar fascia. The fascia was incised bilaterally and the paraspinal  muscles were dissected with a spinous process and lamina in a subperiosteal fashion. Another x-ray was taken for localization and the L3, L4, and L5 levels were localized. Dissection was then carried out laterally over the facet complexes and the transverse processes of L3, L4, and L5 were exposed and decorticated.   Laminectomy was done with double-action rongeurs. Bone was saved, cleaned of soft tissue, and morcellized to later be implanted as locally harvested morcellized autograft. An inferior L3, complete L4, and a superior L5 laminectomy was performed using double-action rongeurs, the high-speed drill and Kerrison punches. Dissection was carried out laterally including bilateral L3-4 and L4-5 facetectomies and foraminotomies with decompression of the stenotic compression of the L3, L4, and L5 nerve roots. Once the decompression of the stenotic compression of the thecal sac and exiting nerve roots was completed we proceeded with the posterior lumbar interbody arthrodesis. The annulus at each level was incised bilaterally and the disc space entered. A very large subligamentous disc herniation fragment was removed at the L4-5 level, and a more modest subligamentous disc herniation was removed at the L3-4 level. A thorough discectomy was performed using pituitary rongeurs and curettes. Once the discectomy was completed we began to prepare the endplate surfaces, removing the cartilaginous endplates surface. We then measured the height of the intervertebral disc space. We selected a 12 x 25 x 4 x 8 AVS peek interbody implants for the L3-4 level, and 13 x 25 x 4 x 11 AVS peek interbody implants for the L4-5 level.  The C-arm fluoroscope was then draped and brought in the field and we identified the pedicle entry points bilaterally at the L3, L4, and L5 levels. Each of the 6 pedicles was probed, we aspirated bone marrow aspirate from the vertebral bodies, this was injected over a 10 cc strip of Vitoss BA. Then  each of the pedicles was examined with  the ball probe, good bony surfaces were found and no bony cuts were found. Each of the pedicles was then tapped with a 5.25 mm tap at the L3 level and a 6.25 mm tap at the L4 and L5 levels. Each screw hole was examined with the ball probe good threading was found and no bony cuts were found. We then placed 5.75 by 45 millimeter screws bilaterally at the L3 level, 6.75 by 45 millimeter screws bilaterally at the L4 level, and 6.75 by 45 millimeter screws bilaterally at the L5 level.  We then packed the AVS peek interbody implants with Vitoss BA with bone marrow aspirate and infuse, and then placed the first implant at the L4-5 level on the right side, carefully retracting the thecal sac and nerve root medially. We then went back to the left side and placed a second implant on the left side again retracting the thecal sac and nerve root medially. Locally harvested morcellized autograft was packed in the disc space lateral to the implants.  Then at the L3-4 level, we placed the first implant on the right side, carefully retracting the thecal sac and nerve root medially. We then went back to the left side and packed the midline with locally harvested morcellized autograft, and then placed a second implant on the left side, again retracting the thecal sac and nerve root medially. Additional locally harvested morcellized autograft was packed lateral to the implants.   We then packed the lateral gutter over the transverse processes and intertransverse space with locally harvested morcellized autograft, Vitoss BA with bone marrow aspirate, and infuse. We then selected 80 mm pre-lordosed rods, they were placed within the screw heads and secured with locking caps once all 6 locking caps were placed final tightening was performed against a counter torque.  The wound had been irrigated multiple times during the procedure with saline solution and bacitracin solution, good hemostasis  was established with a combination of bipolar cautery and Gelfoam with thrombin. The Gelfoam was removed, and a thin layer of Surgifoam applied. Once good hemostasis was confirmed we proceeded with closure paraspinal muscles deep fascia and Scarpa's fascia were closed with interrupted undyed 1 Vicryl sutures the subcutaneous and subcuticular closed with interrupted inverted 2-0 undyed Vicryl sutures the skin edges were approximated with Dermabond. The wound was stressed with sterile gauze and Hypafix.  Following surgery the patient was turned back to the supine position to be reversed and the anesthetic extubated and transferred to the recovery room for further care.   PLAN OF CARE: Admit to inpatient   PATIENT DISPOSITION:  PACU - hemodynamically stable.   Delay start of Pharmacological VTE agent (>24hrs) due to surgical blood loss or risk of bleeding:  yes

## 2017-07-29 NOTE — Anesthesia Preprocedure Evaluation (Signed)
Anesthesia Evaluation  Patient identified by MRN, date of birth, ID band Patient awake    Reviewed: Allergy & Precautions, NPO status , Patient's Chart, lab work & pertinent test results  History of Anesthesia Complications Negative for: history of anesthetic complications  Airway Mallampati: III  TM Distance: >3 FB Neck ROM: Full    Dental  (+) Teeth Intact   Pulmonary sleep apnea and Continuous Positive Airway Pressure Ventilation , former smoker,    breath sounds clear to auscultation       Cardiovascular hypertension, Pt. on medications (-) angina(-) Past MI and (-) CHF  Rhythm:Regular     Neuro/Psych PSYCHIATRIC DISORDERS Anxiety  Neuromuscular disease    GI/Hepatic GERD  Medicated and Controlled,  Endo/Other  Hypothyroidism Morbid obesity  Renal/GU      Musculoskeletal   Abdominal   Peds  Hematology   Anesthesia Other Findings   Reproductive/Obstetrics                             Anesthesia Physical Anesthesia Plan  ASA: II  Anesthesia Plan: General   Post-op Pain Management:    Induction: Intravenous  PONV Risk Score and Plan: 2 and Ondansetron and Dexamethasone  Airway Management Planned: Oral ETT  Additional Equipment: None  Intra-op Plan:   Post-operative Plan: Extubation in OR  Informed Consent: I have reviewed the patients History and Physical, chart, labs and discussed the procedure including the risks, benefits and alternatives for the proposed anesthesia with the patient or authorized representative who has indicated his/her understanding and acceptance.   Dental advisory given  Plan Discussed with: CRNA and Surgeon  Anesthesia Plan Comments:         Anesthesia Quick Evaluation

## 2017-07-29 NOTE — Anesthesia Procedure Notes (Signed)
Procedure Name: Intubation Date/Time: 07/29/2017 7:50 AM Performed by: Montez Moritaarter, Major Santerre W, CRNA Pre-anesthesia Checklist: Patient identified, Emergency Drugs available, Suction available and Patient being monitored Patient Re-evaluated:Patient Re-evaluated prior to induction Oxygen Delivery Method: Circle system utilized Preoxygenation: Pre-oxygenation with 100% oxygen Induction Type: IV induction Ventilation: Mask ventilation without difficulty Laryngoscope Size: Miller and 3 Grade View: Grade II Tube type: Oral Number of attempts: 2 Airway Equipment and Method: Stylet and Oral airway Placement Confirmation: ETT inserted through vocal cords under direct vision,  positive ETCO2 and breath sounds checked- equal and bilateral Secured at: 23 cm Tube secured with: Tape Dental Injury: Teeth and Oropharynx as per pre-operative assessment

## 2017-07-29 NOTE — Transfer of Care (Signed)
Immediate Anesthesia Transfer of Care Note  Patient: Neville Routeric Kall  Procedure(s) Performed: LUMBAR THREE- LUMBAR FOUR, LUMBAR FOUR- LUMBAR FIVE DECOMPRESSION, POSTERIOR LUMBAR INTERBODY FUSION AND POSTERIOR LATERAL ARTHRODESIS (N/A Back)  Patient Location: PACU  Anesthesia Type:General  Level of Consciousness: sedated  Airway & Oxygen Therapy: Patient Spontanous Breathing and Patient connected to face mask oxygen  Post-op Assessment: Report given to RN and Post -op Vital signs reviewed and stable  Post vital signs: Reviewed and stable  Last Vitals:  Vitals:   07/29/17 0550 07/29/17 1400  BP: 135/89 (!) 162/100  Pulse: 92 (!) 123  Resp: 18 10  Temp: 37 C (!) 36.3 C  SpO2: 100% 100%    Last Pain:  Vitals:   07/29/17 1400  TempSrc:   PainSc: (P) Asleep      Patients Stated Pain Goal: 3 (07/29/17 [id number])  Complications: No apparent anesthesia complications

## 2017-07-30 MED ORDER — METHOCARBAMOL 750 MG PO TABS
750.0000 mg | ORAL_TABLET | Freq: Four times a day (QID) | ORAL | Status: DC | PRN
  Administered 2017-07-30 – 2017-08-01 (×8): 750 mg via ORAL
  Filled 2017-07-30 (×8): qty 1

## 2017-07-30 MED ORDER — METHOCARBAMOL 750 MG PO TABS: 750.0000 mg | ORAL_TABLET | Freq: Four times a day (QID) | ORAL | Status: DC | PRN

## 2017-07-30 MED ORDER — NALOXEGOL OXALATE 25 MG PO TABS
25.0000 mg | ORAL_TABLET | Freq: Every day | ORAL | Status: DC
  Administered 2017-07-30 – 2017-08-01 (×3): 25 mg via ORAL
  Filled 2017-07-30 (×3): qty 1

## 2017-07-30 MED ORDER — POLYETHYLENE GLYCOL 3350 17 G PO PACK
17.0000 g | PACK | Freq: Every day | ORAL | Status: DC | PRN
  Administered 2017-07-30 – 2017-07-31 (×2): 17 g via ORAL
  Filled 2017-07-30 (×2): qty 1

## 2017-07-30 MED FILL — Thrombin For Soln 20000 Unit: CUTANEOUS | Qty: 1 | Status: AC

## 2017-07-30 MED FILL — Thrombin For Soln 5000 Unit: CUTANEOUS | Qty: 5000 | Status: AC

## 2017-07-30 NOTE — Progress Notes (Signed)
Vitals:   07/29/17 2000 07/29/17 2344 07/30/17 0400 07/30/17 0715  BP: 136/87 139/69 (!) 142/91 121/79  Pulse: (!) 133 (!) 115 (!) 104 (!) 114  Resp: 20 (!) 22 20 18   Temp: 98.2 F (36.8 C) 98.4 F (36.9 C) 98.6 F (37 C) 97.8 F (36.6 C)  TempSrc: Oral Oral Oral Oral  SpO2: 98% 100% 100% 98%    Patient sitting up on the side of the bed, has finished breakfast. Has been ambulating in the halls. Moderate pain though with transfers. Pain medication wearing off after about 3-1/2 hours. Requiring both pain medication and muscle relaxants. Dressing changed due to moderate amount of wound drainage.  We'll have nursing staff teach the patient's wife dressing changes, so they can be continued at home.  Have consulted physical therapy to work with the patient and his wife on transfers.  He has had difficulties with constipation with narcotics, despite laxatives and stool softeners. We'll order Movantik.  Plan: Encouraged to ambulate. PT consultation (discussed with PT on unit). Movatik for narcotic associated constipation.  Continue dressing changes when necessary.  Hewitt ShortsNUDELMAN,ROBERT W, MD 07/30/2017, 8:40 AM

## 2017-07-30 NOTE — Evaluation (Addendum)
Physical Therapy Evaluation and Discharge Patient Details Name: Jesse Reilly MRN: [id number] DOB: 1980/08/29 Today's Date: 07/30/2017   History of Present Illness  Pt is a 37 y/o male who presents s/p L3-L5 decompression anf fusion on 07/29/17. PMH significant for HTN, anxiety.  Clinical Impression  Patient evaluated by Physical Therapy with no further acute PT needs identified. All education has been completed and the patient has no further questions. At the time of PT eval pt was ambulating with mod I in the hall with brace donned. Pt fairly tall and required education with bed mobility and transfers from a lower surface. Pt and wife were instructed on proper log roll technique with HOB flat and rails lowered. Wife educated on good body mechanics when assisting pt. Increased time required however pt able to complete with min assist for LE elevation up into bed only. Pt also educated on appropriate activity progression, brace application/wearing schedule, precautions, car transfer, and general safety with mobility upon return home. See below for any follow-up Physical Therapy or equipment needs. PT is signing off. Thank you for this referral.     Follow Up Recommendations No PT follow up;Supervision for mobility/OOB    Equipment Recommendations  3-in-1 bedside commode (PT)    Recommendations for Other Services       Precautions / Restrictions Precautions Precautions: Fall;Back Precaution Booklet Issued: Yes (comment) Precaution Comments: Reviewed handout and pt was able to recall precautions at end of session Required Braces or Orthoses: Spinal Brace Spinal Brace: Lumbar corset;Applied in standing position Restrictions Weight Bearing Restrictions: No      Mobility  Bed Mobility Overal bed mobility: Needs Assistance Bed Mobility: Rolling;Sidelying to Sit;Sit to Sidelying Rolling: Supervision Sidelying to sit: Supervision     Sit to sidelying: Min assist General bed mobility  comments: Assist for LE elevation into bed. Pt was instructed on log roll technique. Practiced with and without rails at the height of the bed he has at home to simulate home environment. Pt's wife was instructed on ways to safely assist.   Transfers Overall transfer level: Needs assistance Equipment used: None Transfers: Sit to/from Stand Sit to Stand: Supervision         General transfer comment: Light supervision for safety. No assist required, however increased time to achieve full stand.   Ambulation/Gait Ambulation/Gait assistance: Modified independent (Device/Increase time) Ambulation Distance (Feet): 400 Feet Assistive device: None Gait Pattern/deviations: Step-through pattern;Decreased stride length Gait velocity: Decreased Gait velocity interpretation: Below normal speed for age/gender General Gait Details: Pt ambulating without difficulty. No assist required and no unsteadiness noted.   Stairs Stairs: Yes Stairs assistance: Modified independent (Device/Increase time) Stair Management: Two rails;Step to pattern;Alternating pattern;Forwards Number of Stairs: 10 General stair comments: VC's for sequencing and safety. Pt was able to progress to alternating step pattern on the stairs.   Wheelchair Mobility    Modified Rankin (Stroke Patients Only)       Balance Overall balance assessment: Needs assistance Sitting-balance support: Feet supported;No upper extremity supported Sitting balance-Leahy Scale: Good     Standing balance support: No upper extremity supported;During functional activity Standing balance-Leahy Scale: Fair                               Pertinent Vitals/Pain Pain Assessment: Faces Faces Pain Scale: Hurts even more Pain Location: Back Pain Descriptors / Indicators: Operative site guarding Pain Intervention(s): Limited activity within patient's tolerance;Monitored during session;Repositioned    Home  Living Family/patient expects  to be discharged to:: Private residence Living Arrangements: Spouse/significant other Available Help at Discharge: Family Type of Home: House Home Access: Stairs to enter Entrance Stairs-Rails: Right;Left;Can reach both Secretary/administratorntrance Stairs-Number of Steps: 8 Home Layout: Two level Home Equipment: None      Prior Function Level of Independence: Independent               Hand Dominance        Extremity/Trunk Assessment   Upper Extremity Assessment Upper Extremity Assessment: Overall WFL for tasks assessed    Lower Extremity Assessment Lower Extremity Assessment: Overall WFL for tasks assessed    Cervical / Trunk Assessment Cervical / Trunk Assessment: Other exceptions Cervical / Trunk Exceptions: s/p surgery  Communication   Communication: No difficulties  Cognition Arousal/Alertness: Awake/alert Behavior During Therapy: WFL for tasks assessed/performed Overall Cognitive Status: Within Functional Limits for tasks assessed                                        General Comments      Exercises     Assessment/Plan    PT Assessment Patent does not need any further PT services  PT Problem List         PT Treatment Interventions      PT Goals (Current goals can be found in the Care Plan section)  Acute Rehab PT Goals Patient Stated Goal: Home today PT Goal Formulation: All assessment and education complete, DC therapy    Frequency     Barriers to discharge        Co-evaluation               AM-PAC PT "6 Clicks" Daily Activity  Outcome Measure Difficulty turning over in bed (including adjusting bedclothes, sheets and blankets)?: None Difficulty moving from lying on back to sitting on the side of the bed? : A Little Difficulty sitting down on and standing up from a chair with arms (e.g., wheelchair, bedside commode, etc,.)?: A Little Help needed moving to and from a bed to chair (including a wheelchair)?: A Little Help needed  walking in hospital room?: A Little Help needed climbing 3-5 steps with a railing? : A Little 6 Click Score: 19    End of Session Equipment Utilized During Treatment: Back brace Activity Tolerance: Patient tolerated treatment well Patient left: in bed;with call bell/phone within reach;with family/visitor present Nurse Communication: Mobility status PT Visit Diagnosis: Pain;Other symptoms and signs involving the nervous system (R29.898) Pain - part of body: (back)    Time: [id number] PT Time Calculation (min) (ACUTE ONLY): 32 min   Charges:   PT Evaluation $PT Eval Moderate Complexity: 1 Mod PT Treatments $Gait Training: 8-22 mins   PT G Codes:        Conni SlipperLaura Keshana Klemz, PT, DPT Acute Rehabilitation Services Pager: [phone]   Marylynn PearsonLaura D Karlyn Glasco 07/30/2017, 11:01 AM

## 2017-07-31 MED ORDER — SORBITOL 70 % SOLN
30.0000 mL | Freq: Once | Status: DC
  Filled 2017-07-31: qty 30

## 2017-07-31 MED ORDER — NALOXEGOL OXALATE 25 MG PO TABS: 25.0000 mg | ORAL_TABLET | Freq: Every day | ORAL | 0 refills | Status: AC

## 2017-07-31 MED ORDER — MAGNESIUM CITRATE PO SOLN
1.0000 | Freq: Once | ORAL | Status: AC
Start: 2017-07-31 — End: 2017-07-31
  Administered 2017-07-31: 1 via ORAL
  Filled 2017-07-31: qty 296

## 2017-07-31 MED ORDER — METHOCARBAMOL 750 MG PO TABS: 750.0000 mg | ORAL_TABLET | Freq: Four times a day (QID) | ORAL | 1 refills | Status: AC | PRN

## 2017-07-31 MED ORDER — HYDROCODONE-ACETAMINOPHEN 5-325 MG PO TABS
1.0000 | ORAL_TABLET | ORAL | 0 refills | Status: AC | PRN
Start: 2017-07-31 — End: ?

## 2017-07-31 NOTE — Progress Notes (Signed)
Patient uncomfortable to be discharged d/t constipation. Prn medication for constipation given per protocol. MD aware and updated about patient situation. Order to cancel discharged order carried out and report given to receiving nurse. Will continue to monitor.

## 2017-07-31 NOTE — Discharge Summary (Signed)
Physician Discharge Summary  Patient ID: Jesse Reilly MRN: [id number] DOB/AGE: 12-01-1980 36 y.o.  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  As operatively he's been up and ambulating. He's been voiding well. We did consult physical therapy to work on his transfers and stairs. He's had moderate difficulties with constipation, which she typically has with narcotic medications. We added Movantik to stool softeners and laxatives, and he has had a small bowel movement.  He has had some mild bloody drainage from his incision, and we have been doing dressing changes as needed. His wife has been instructed in continued dressing changes. The incision itself is healing well with no erythema.  He is scheduled for follow-up with me in 3 weeks. He and his wife have been given instructions regarding wound care and activities following discharge.  Consults: Physical therapy  Discharge Exam: Blood pressure (!) 146/96, pulse (!) 116, temperature 99.4 F (37.4 C), resp. rate 20, SpO2 99 %.  Disposition:  home  Discharge Instructions    Discharge wound care:   Complete by:  As directed    Keep dressing over incision for at least the first week. Change dressing daily and as needed. Shower daily with the wound uncovered. Water and soapy water should run over the incision area. Do not wash directly on the incision for 2 weeks. Remove the glue after 2 weeks.   Driving  Restrictions   Complete by:  As directed    No driving for 3 weeks. May ride in the car locally.  Will give further instruction at postop visit.   Other Restrictions   Complete by:  As directed    Walk gradually increasing distances out in the fresh air at least twice a day. Walking additional 6 times inside the house, gradually increasing distances, daily. No bending, lifting, or twisting. Perform activities between shoulder and waist height (that is at counter height when standing or table height when sitting).     Allergies as of 07/31/2017      Reactions   Lisinopril Swelling   Swelling in hands.      Medication List    TAKE these medications   aspirin EC 81 MG tablet Take 81 mg by mouth daily.   chlorthalidone 25 MG tablet Commonly known as:  HYGROTON Take 25 mg by mouth daily.   CLEAR EYES OP Place 1-2 drops into both eyes 3 (three) times daily as needed (for dry/irritated eye).   clonazePAM 1 MG tablet Commonly known as:  KLONOPIN Take 1 mg by mouth 4 (four) times daily as needed for anxiety.   cyclobenzaprine 5 MG tablet Commonly known as:  FLEXERIL Take 5 mg by mouth 3 (three) times daily as needed for muscle spasms.   diclofenac 75 MG EC tablet Commonly known as:  VOLTAREN Take 75 mg by mouth 2 (two) times daily.   HYDROcodone-acetaminophen 7.5-325 MG tablet Commonly known as:  NORCO Take 1 tablet by mouth every 6 (six) hours as needed (for pain.). What changed:  Another medication with the same name was added. Make sure you understand  how and when to take each.   HYDROcodone-acetaminophen 5-325 MG tablet Commonly known as:  NORCO/VICODIN Take 1-2 tablets by mouth every 4 (four) hours as needed (pain). What changed:  You were already taking a medication with the same name, and this prescription was added. Make sure you understand how and when to take each.   levothyroxine 200 MCG tablet Commonly known as:  SYNTHROID, LEVOTHROID Take 200 mcg by mouth daily  before breakfast.   methocarbamol 750 MG tablet Commonly known as:  ROBAXIN Take 1 tablet (750 mg total) by mouth every 6 (six) hours as needed for muscle spasms.   naloxegol oxalate 25 MG Tabs tablet Commonly known as:  MOVANTIK Take 1 tablet (25 mg total) by mouth daily. Start taking on:  08/01/2017   omeprazole 20 MG capsule Commonly known as:  PRILOSEC Take 20 mg by mouth daily as needed (for acid reflux.).   potassium chloride SA 20 MEQ tablet Commonly known as:  K-DUR,KLOR-CON Take 20 mEq by mouth daily.            Durable Medical Equipment  (From admission, onward)        Start     Ordered   07/30/17 1205  For home use only DME 3 n 1  Once    Comments:  WIDE- PATIENT WEIGHS 308 POUNDS   07/30/17 1204       Discharge Care Instructions  (From admission, onward)        Start     Ordered   07/31/17 0000  Discharge wound care:    Comments:  Keep dressing over incision for at least the first week. Change dressing daily and as needed. Shower daily with the wound uncovered. Water and soapy water should run over the incision area. Do not wash directly on the incision for 2 weeks. Remove the glue after 2 weeks.   07/31/17 1436       Signed: Hewitt ShortsNUDELMAN,ROBERT W 07/31/2017, 2:36 PM

## 2017-08-01 MED FILL — Heparin Sodium (Porcine) Inj 1000 Unit/ML: INTRAMUSCULAR | Qty: 30 | Status: AC

## 2017-08-01 MED FILL — Sodium Chloride IV Soln 0.9%: INTRAVENOUS | Qty: 2000 | Status: AC

## 2017-08-01 NOTE — Progress Notes (Signed)
Patient alert and oriented, mae's well, voiding adequate amount of urine, swallowing without difficulty, feels comfortable with going home after having BMs with active bowel sounds and  c/o mild pain at time of discharge. Patient discharged home with family. Script and discharged instructions given to patient. Patient and family stated understanding of instructions given. Patient has an appointment with Dr. Newell CoralNudelman

## 2017-08-01 NOTE — Anesthesia Postprocedure Evaluation (Signed)
Anesthesia Post Note  Patient: Jesse Reilly  Procedure(s) Performed: LUMBAR THREE- LUMBAR FOUR, LUMBAR FOUR- LUMBAR FIVE DECOMPRESSION, POSTERIOR LUMBAR INTERBODY FUSION AND POSTERIOR LATERAL ARTHRODESIS (N/A Back)     Patient location during evaluation: PACU Anesthesia Type: General Level of consciousness: awake and alert Pain management: pain level controlled Vital Signs Assessment: post-procedure vital signs reviewed and stable Respiratory status: spontaneous breathing, nonlabored ventilation, respiratory function stable and patient connected to nasal cannula oxygen Cardiovascular status: blood pressure returned to baseline and stable Postop Assessment: no apparent nausea or vomiting Anesthetic complications: no    Last Vitals:  Vitals:   08/01/17 0400 08/01/17 0748  BP: 135/89 135/84  Pulse: (!) 113 (!) 102  Resp: 20 16  Temp: 37.2 C 36.7 C  SpO2: 97% 100%    Last Pain:  Vitals:   08/01/17 1001  TempSrc:   PainSc: 4                  Antionio Negron

## 2018-07-08 IMAGING — RF DG C-ARM 61-120 MIN
1 series · 3 of 3 positions shown · non-contrast
Comparison: MRI lumbar spine 06/29/2017.

CLINICAL DATA: Intraoperative imaging for L3-5 laminectomy and
fusion.

EXAM:
LUMBAR SPINE - 2-3 VIEW; DG C-ARM 61-120 MIN

[Series 1: run · 3 of 3 slices shown]
[im 1/3]
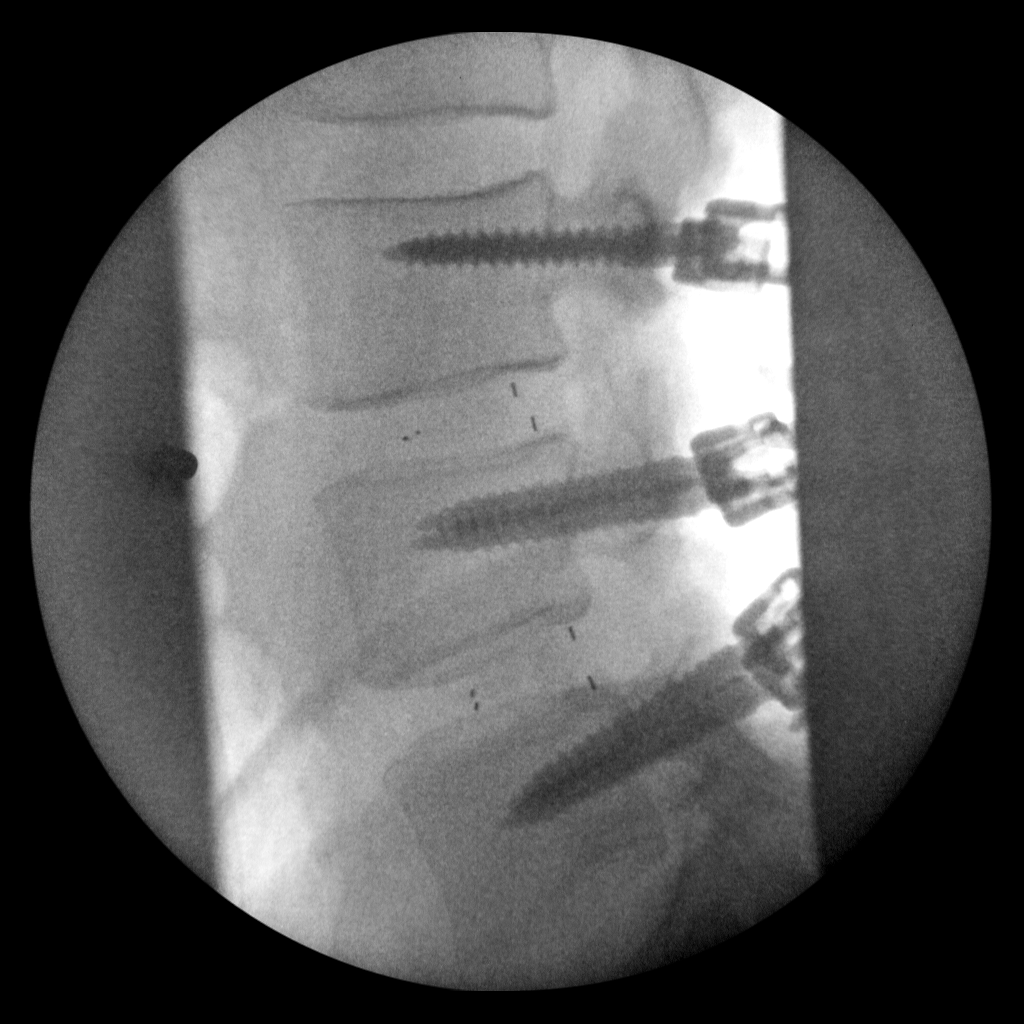
[im 2/3]
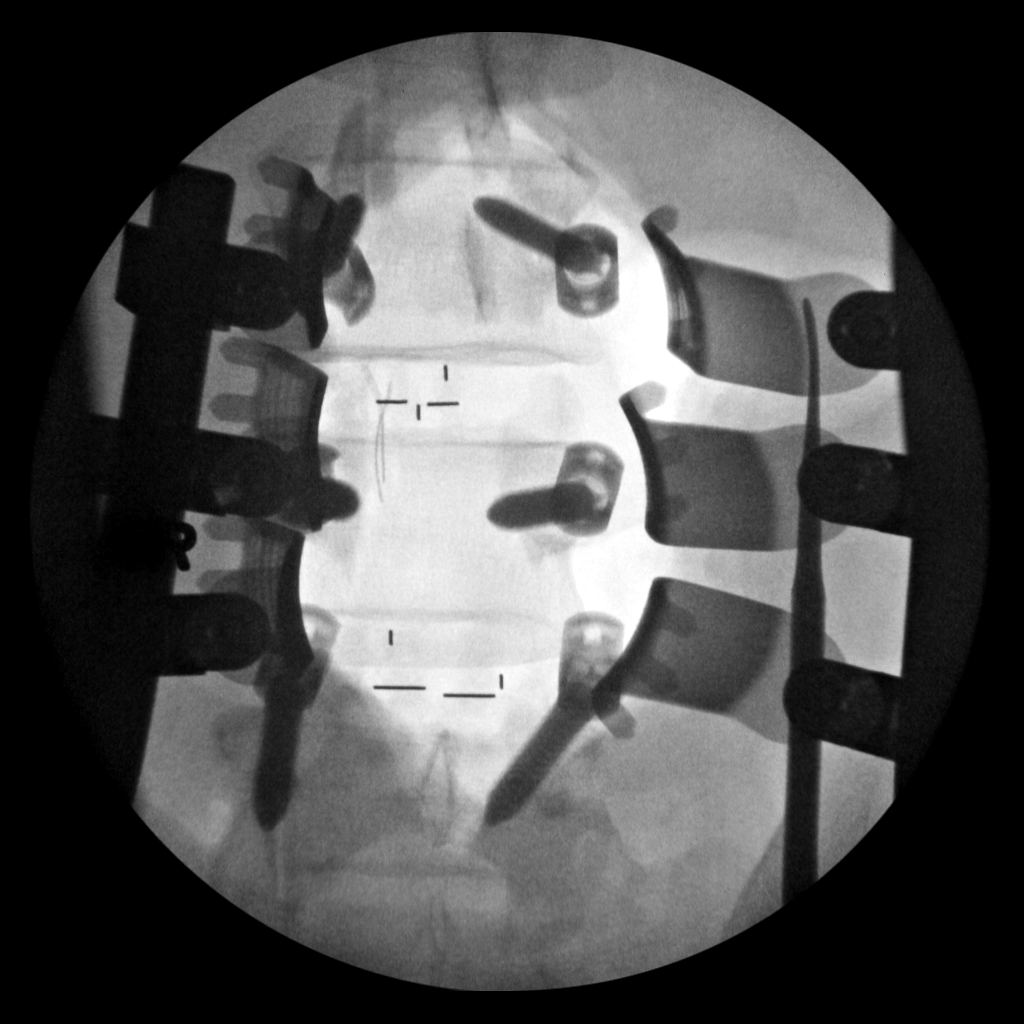
[im 3/3]
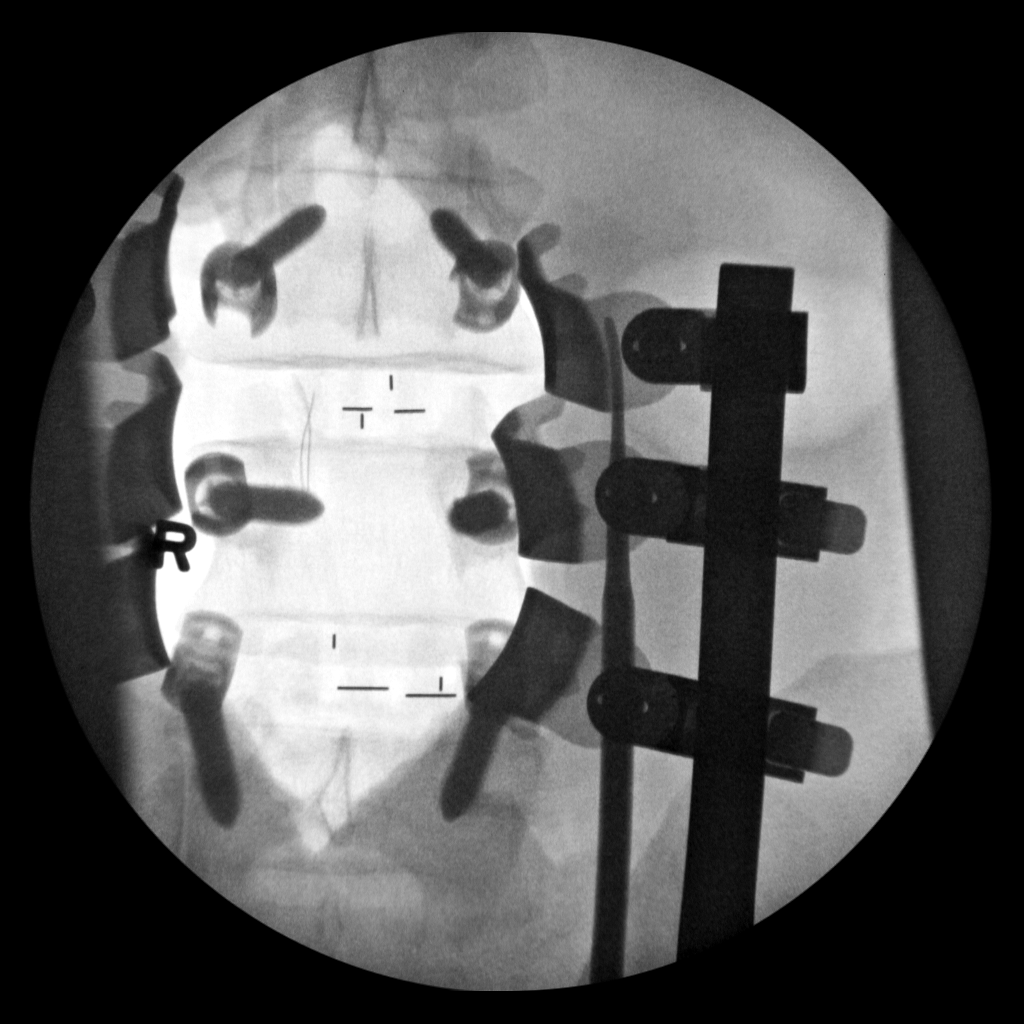

[3 of 3 positions shown; findings below may reference images not displayed]

FINDINGS: Two fluoroscopic intraoperative spot views of the lumbar spine are
provided. Pedicle screws and interbody spaces are in place from
L3-L5. No acute abnormality is identified.
IMPRESSION: Intraoperative imaging for L3-5 PLIF.

## 2018-07-08 IMAGING — CR DG LUMBAR SPINE 2-3V
2 series · 2 of 2 positions shown · non-contrast
Comparison: Lumbar spine radiographs July 15, 2017

CLINICAL DATA: Lumbar decompression

EXAM:
LUMBAR SPINE - 2-3 VIEW

[lateral (1 of 2)]
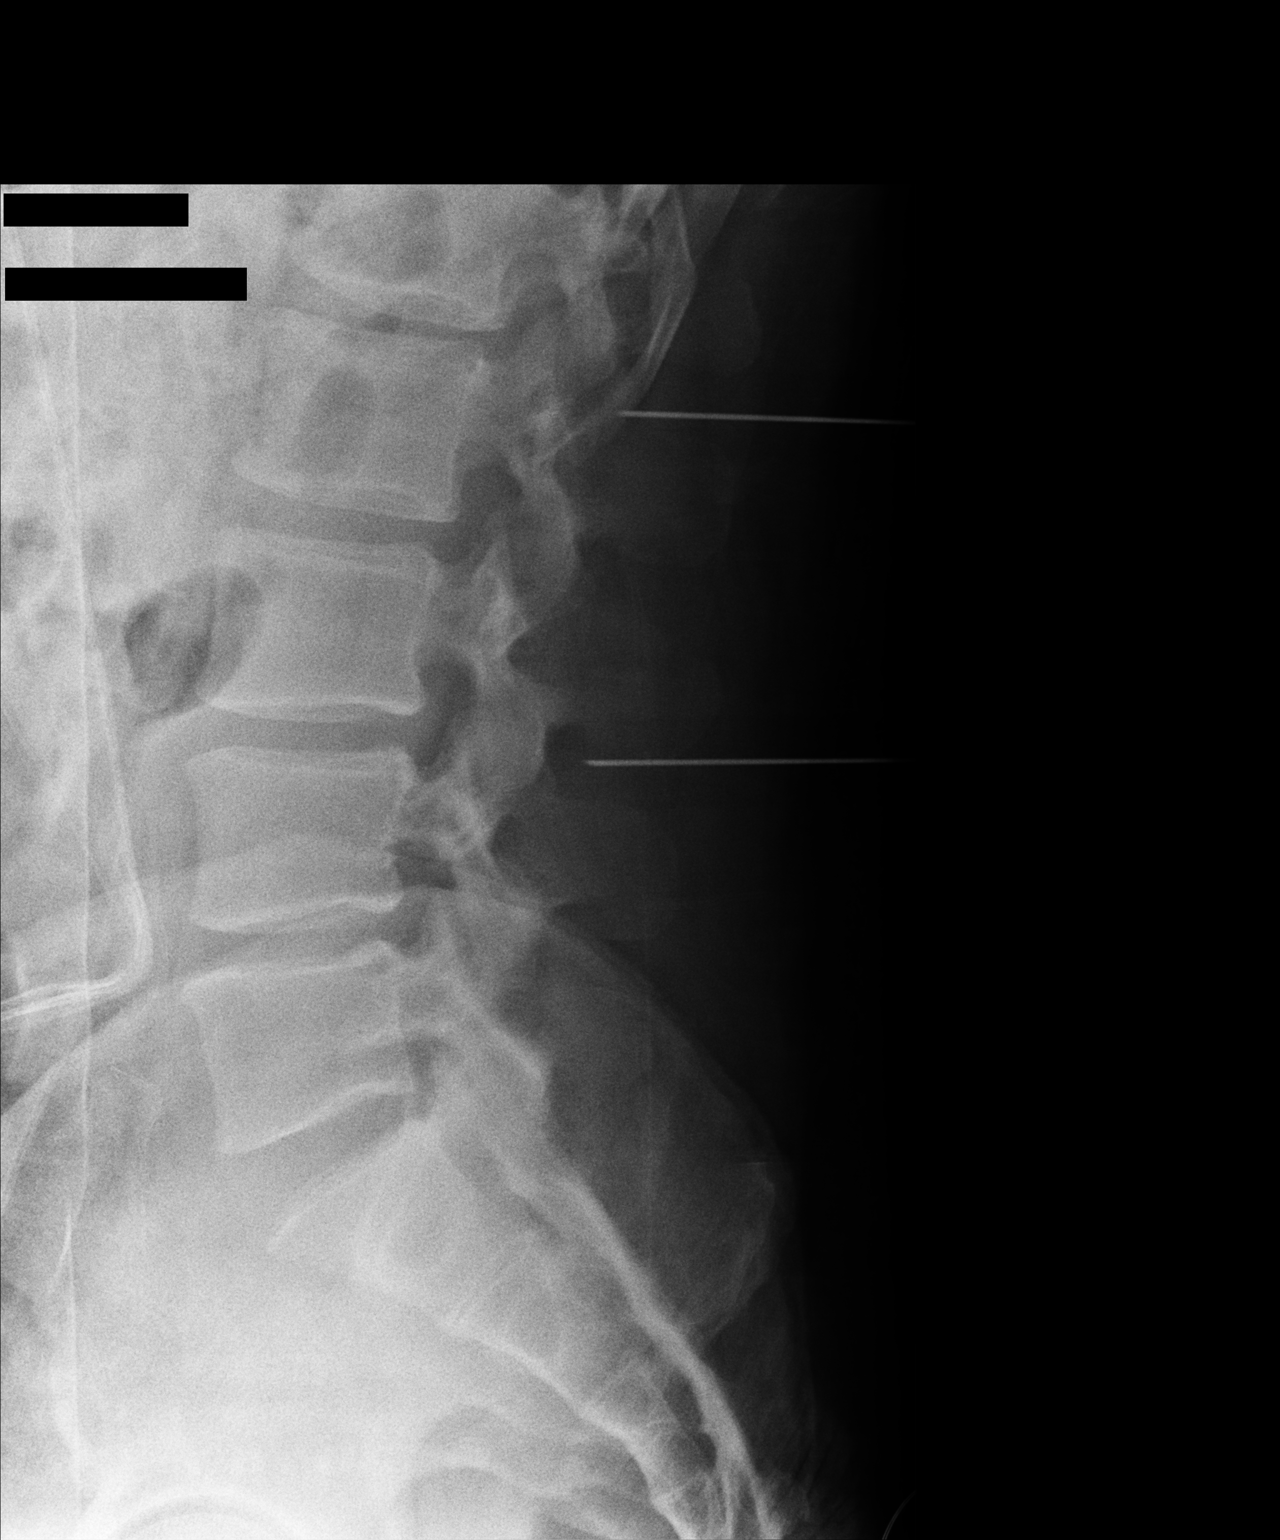

[lateral (2 of 2)]
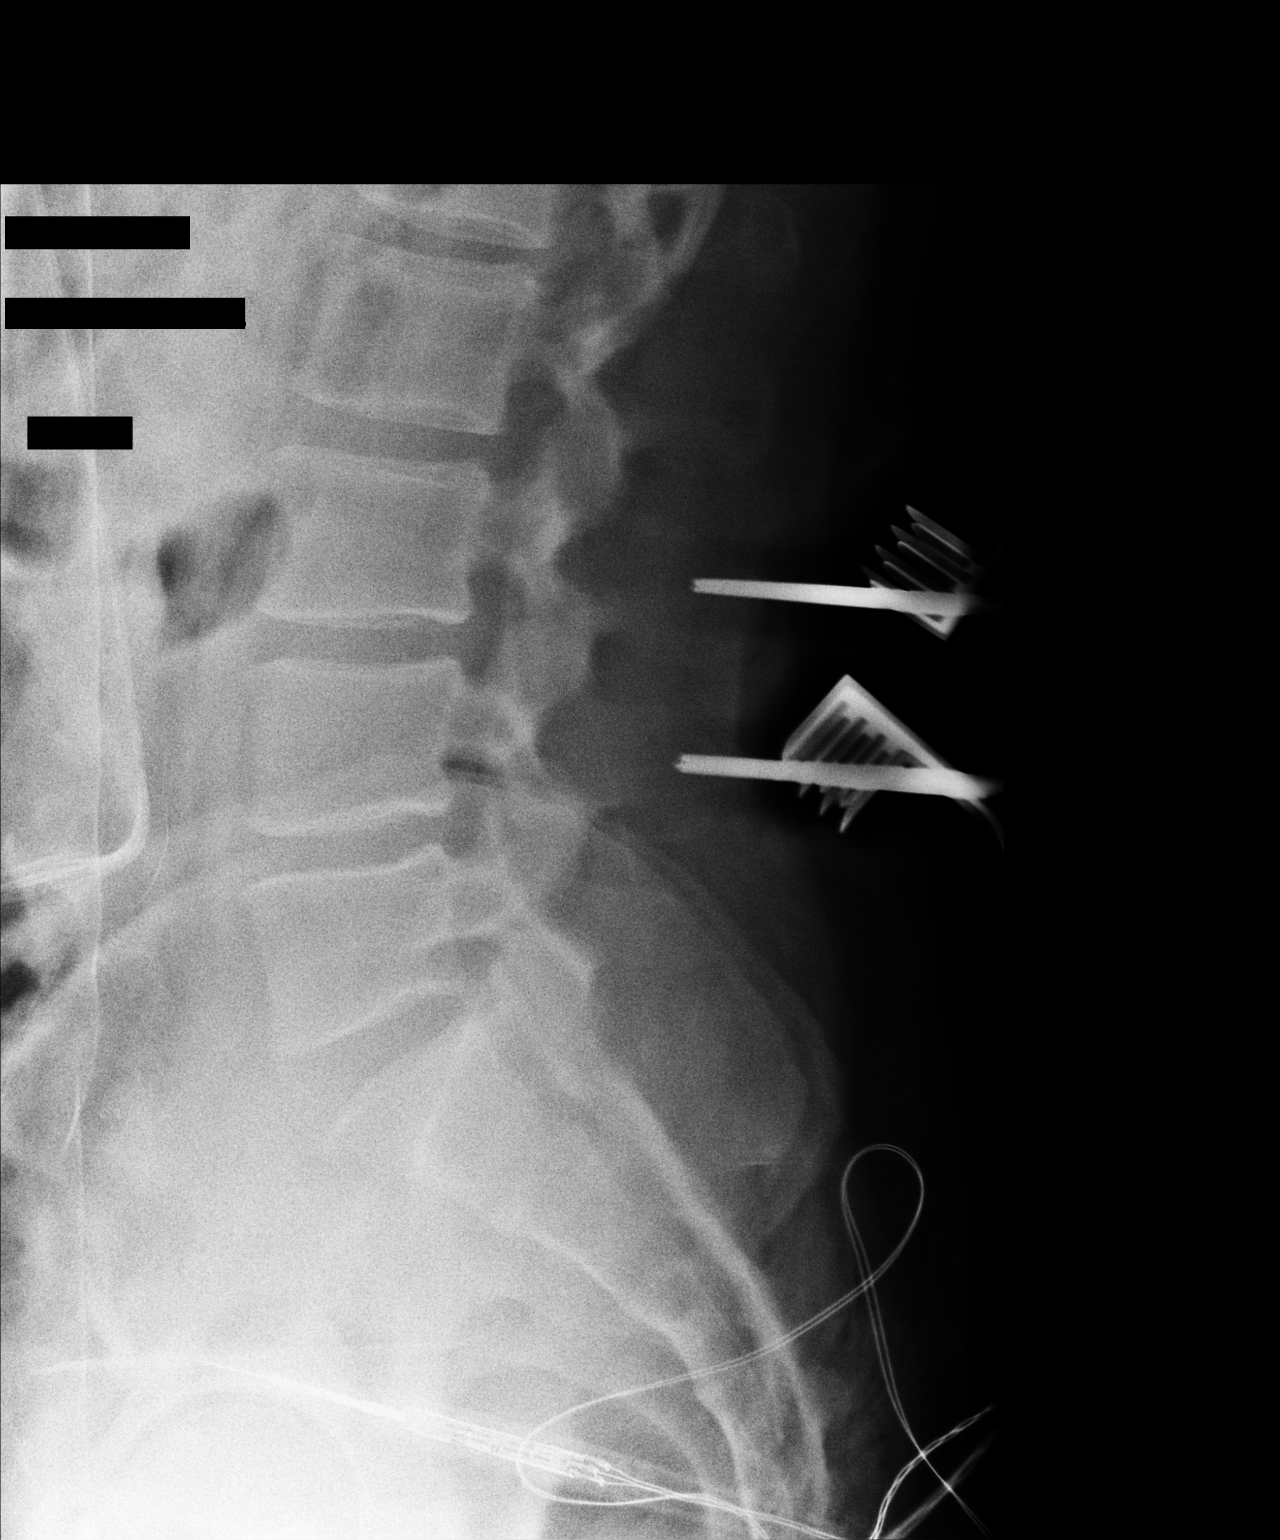

[2 of 2 positions shown; findings below may reference images not displayed]

FINDINGS: Lateral lumbar image labeled #1 submitted. Metallic probe tips are
posterior to the superior aspect of the L2 vertebral body and
posterior to the L3-4 interspace respectively. No fracture or
spondylolisthesis.

Lateral lumbar image labeled #2 submitted. Metallic probe tips are
posterior to the inferior aspect of the L3 vertebral body and
posterior to the inferior aspect of the L4 vertebral body
respectively. No fracture or spondylolisthesis.
IMPRESSION: On the final submitted cross-table lateral image, metallic probe
tips are posterior to the inferior aspect of the L3 vertebral body
and posterior to the inferior aspect of the L4 vertebral body
respectively. No fracture or spondylolisthesis.

## 2020-11-23 ENCOUNTER — Other Ambulatory Visit: Payer: Managed Care, Other (non HMO)
